# Patient Record
Sex: Female | Born: 1995 | Race: White | Hispanic: No | Marital: Single | Smoking: Never smoker
Health system: Southern US, Community
[De-identification: ages and names within clinical notes are randomized; demographics above are authoritative.]

## PROBLEM LIST (undated history)

## (undated) DIAGNOSIS — M545 Low back pain, unspecified: Secondary | ICD-10-CM

## (undated) DIAGNOSIS — S060XAA Concussion with loss of consciousness status unknown, initial encounter: Secondary | ICD-10-CM

## (undated) DIAGNOSIS — S060X9A Concussion with loss of consciousness of unspecified duration, initial encounter: Secondary | ICD-10-CM

---
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
## 2016-08-12 IMAGING — CR DG LUMBAR SPINE COMPLETE 4+V
5 series · 5 of 5 positions shown · non-contrast
Comparison: None.

CLINICAL DATA: Bilateral leg numbness

EXAM:
LUMBAR SPINE - COMPLETE 4+ VIEW

[l-spine ap]
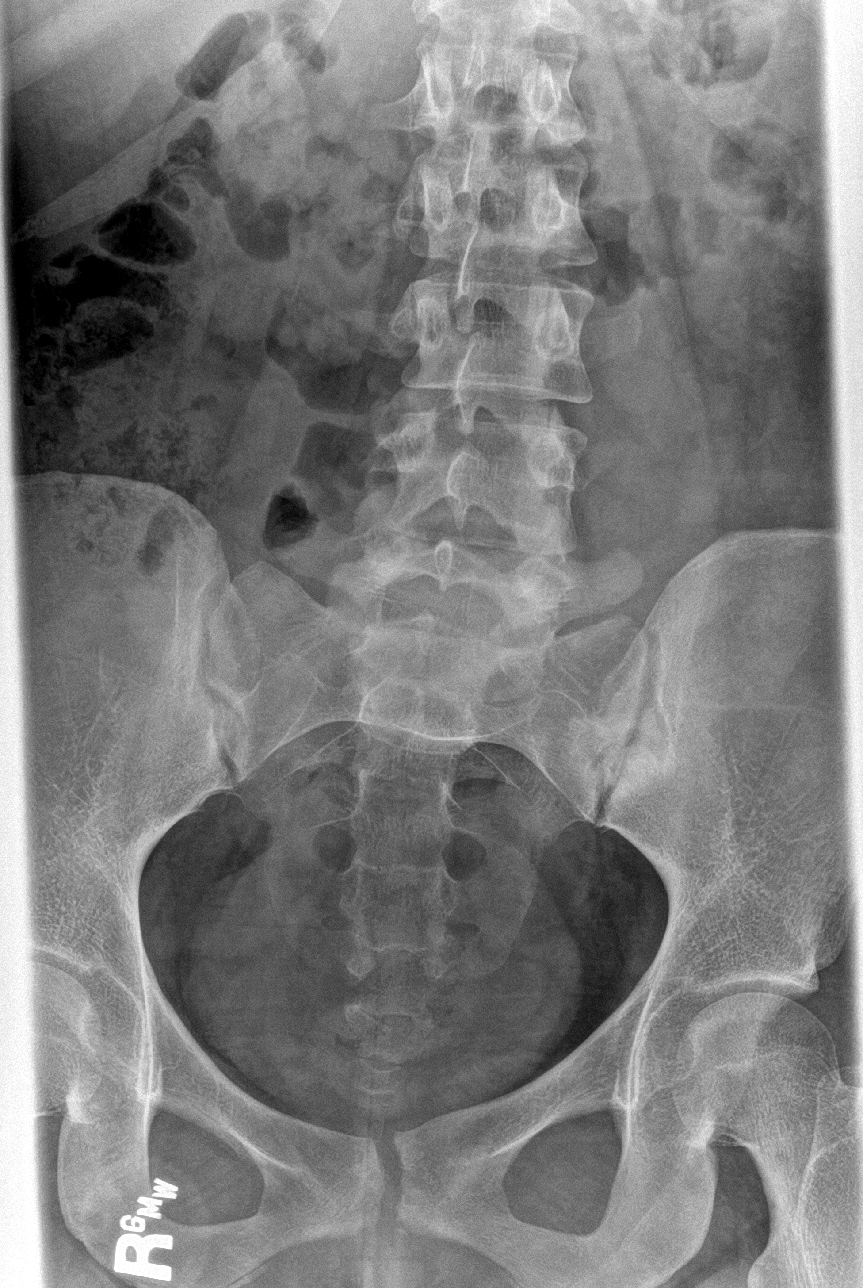

[l-spine obl (1 of 2)]
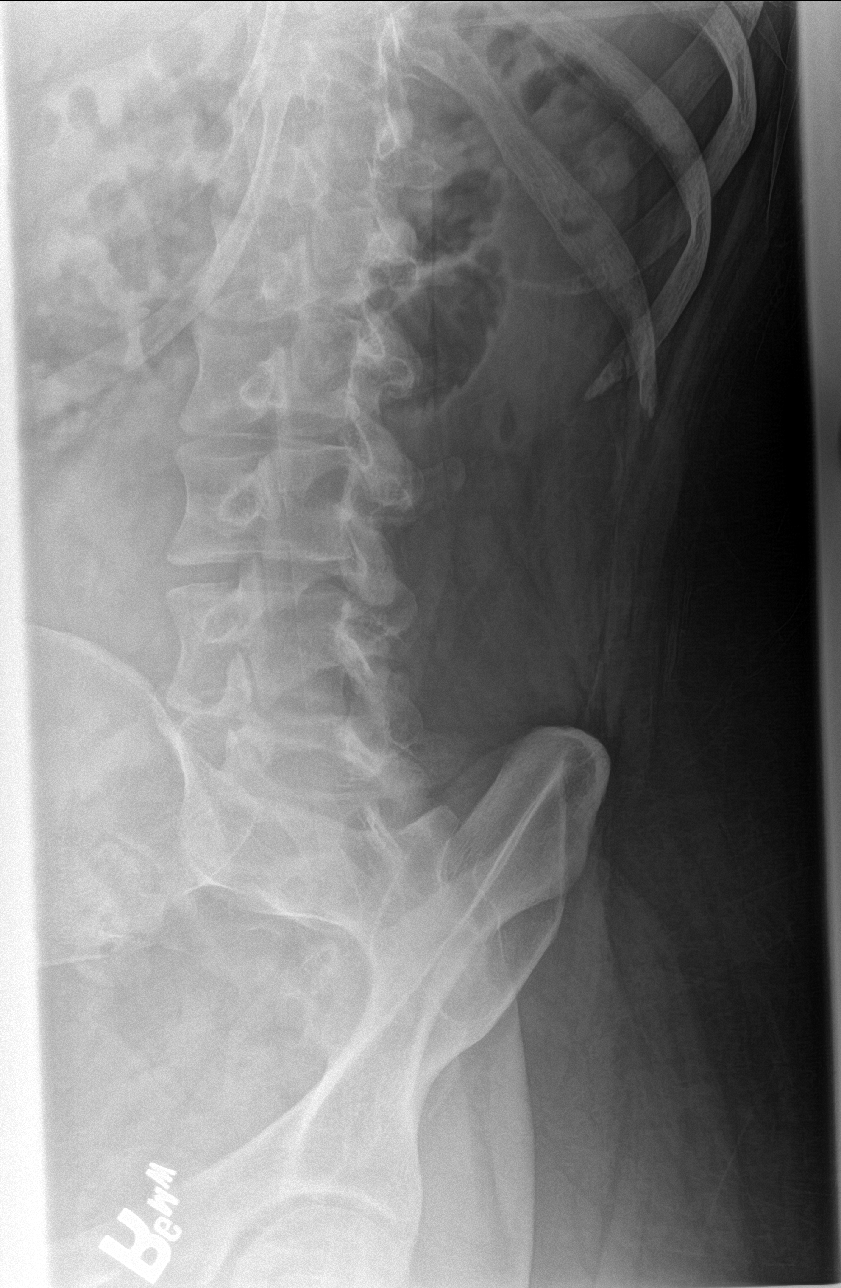

[l-spine obl (2 of 2)]
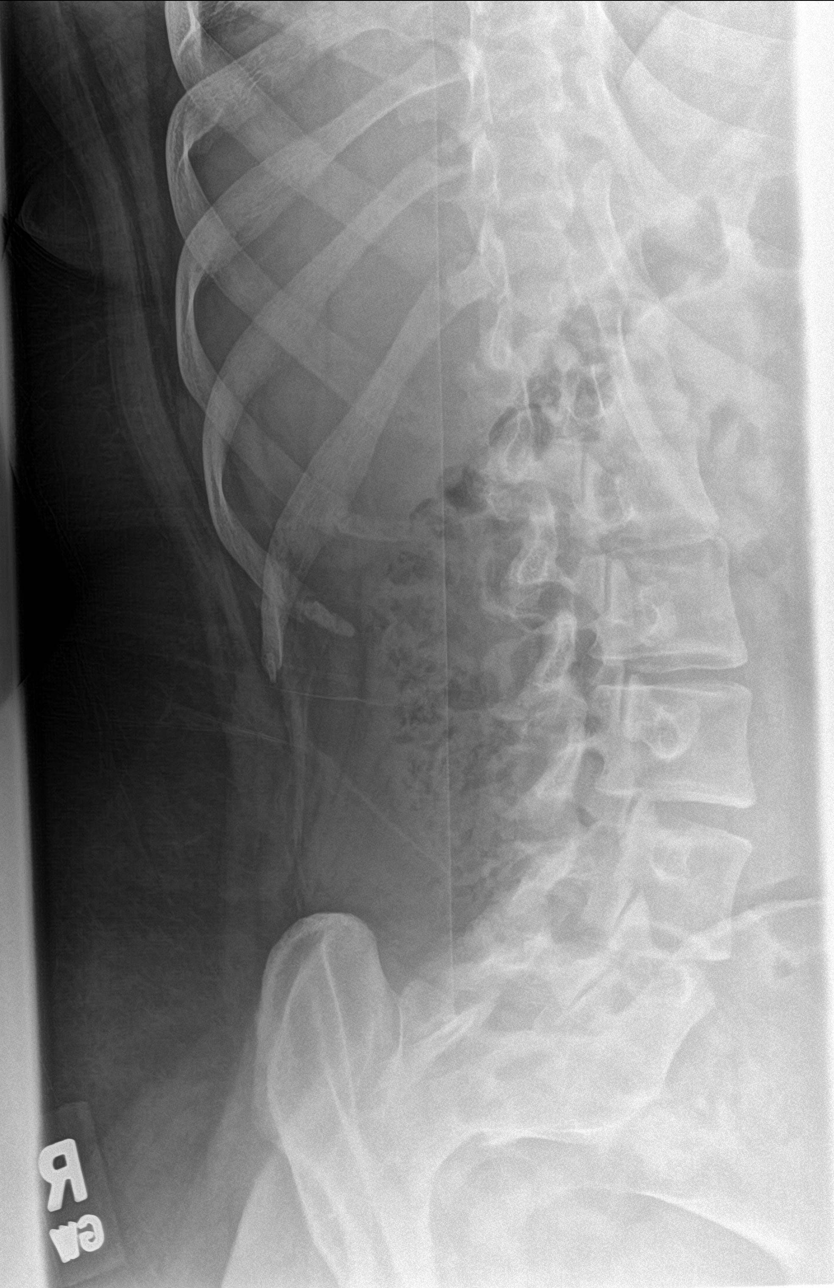

[l-spine lat]
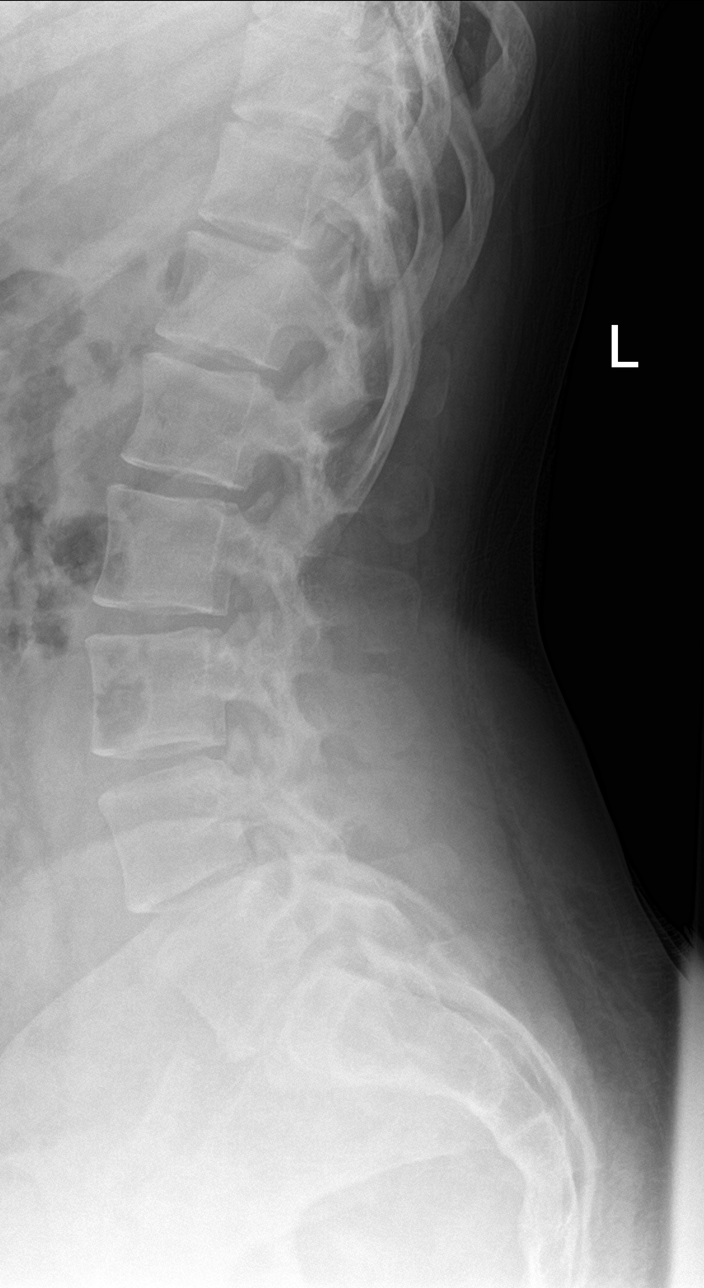

[l-spine spot]
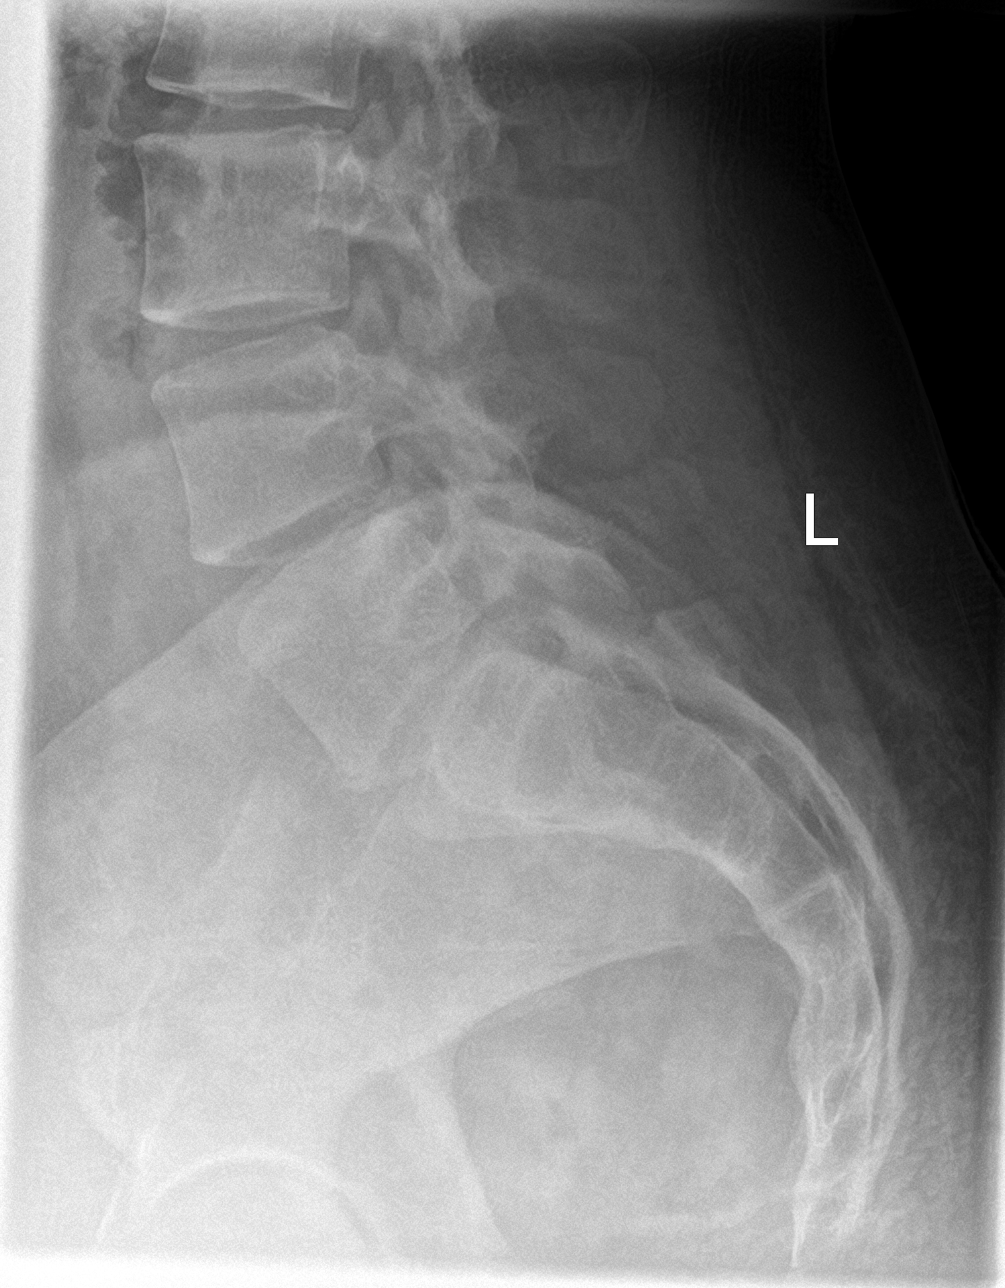

[5 of 5 positions shown; findings below may reference images not displayed]

FINDINGS: There is no evidence of lumbar spine fracture. Alignment is normal.
Disc space narrowing is noted at L4-5 and L5-S1. No soft tissue
abnormality is seen.
IMPRESSION: Mild degenerative change.

## 2017-01-06 ENCOUNTER — Encounter (HOSPITAL_COMMUNITY): Payer: Self-pay | Admitting: Emergency Medicine

## 2017-01-06 ENCOUNTER — Other Ambulatory Visit: Payer: Self-pay

## 2017-01-06 DIAGNOSIS — Z5321 Procedure and treatment not carried out due to patient leaving prior to being seen by health care provider: Secondary | ICD-10-CM | POA: Insufficient documentation

## 2017-01-06 DIAGNOSIS — R55 Syncope and collapse: Secondary | ICD-10-CM | POA: Diagnosis present

## 2017-01-06 NOTE — ED Triage Notes (Signed)
Report from [facility]> pt had syncopal episode while Face Timing in her dorm room.  Roommate found her face down with head under her desk.  Pt reports feeling dizzy prior to event.  Now c/o dizziness, pain across chest that is worse with palpation and deep inspiration, and pain to forehead.  Pt alert and oriented.  C-collar in place by EMS.  Pt denies neck pain.

## 2017-01-07 ENCOUNTER — Emergency Department (HOSPITAL_COMMUNITY)
Admission: EM | Admit: 2017-01-07 | Discharge: 2017-01-07 | Disposition: A | Discharge status: Home / Self Care | Payer: BLUE CROSS/BLUE SHIELD | Attending: Emergency Medicine | Admitting: Emergency Medicine

## 2017-01-07 HISTORY — DX: Low back pain: M54.5

## 2017-01-07 HISTORY — DX: Concussion with loss of consciousness status unknown, initial encounter: S06.0XAA

## 2017-01-07 HISTORY — DX: Concussion with loss of consciousness of unspecified duration, initial encounter: S06.0X9A

## 2017-01-07 HISTORY — DX: Low back pain, unspecified: M54.50

## 2017-01-07 LAB — CBC
HEMATOCRIT: 40.7 % (ref 36.0–46.0)
Hemoglobin: 13.4 g/dL (ref 12.0–15.0)
MCH: 28 pg (ref 26.0–34.0)
MCHC: 32.9 g/dL (ref 30.0–36.0)
MCV: 85.1 fL (ref 78.0–100.0)
Platelets: 242 10*3/uL (ref 150–400)
RBC: 4.78 MIL/uL (ref 3.87–5.11)
RDW: 12.9 % (ref 11.5–15.5)
WBC: 9.4 10*3/uL (ref 4.0–10.5)

## 2017-01-07 LAB — BASIC METABOLIC PANEL
ANION GAP: 9 (ref 5–15)
BUN: 7 mg/dL (ref 6–20)
CALCIUM: 9 mg/dL (ref 8.9–10.3)
CO2: 21 mmol/L — ABNORMAL LOW (ref 22–32)
Chloride: 106 mmol/L (ref 101–111)
Creatinine, Ser: 0.65 mg/dL (ref 0.44–1.00)
GFR calc Af Amer: >60 mL/min (ref >60)
Glucose, Bld: 103 mg/dL — ABNORMAL HIGH (ref 65–99)
POTASSIUM: 3.8 mmol/L (ref 3.5–5.1)
SODIUM: 136 mmol/L (ref 135–145)

## 2017-01-07 LAB — URINALYSIS, ROUTINE W REFLEX MICROSCOPIC
BILIRUBIN URINE: NEGATIVE
Glucose, UA: NEGATIVE mg/dL
Hgb urine dipstick: NEGATIVE
KETONES UR: NEGATIVE mg/dL
LEUKOCYTES UA: NEGATIVE
NITRITE: NEGATIVE
PH: 6 (ref 5.0–8.0)
PROTEIN: NEGATIVE mg/dL
Specific Gravity, Urine: 1.018 (ref 1.005–1.030)

## 2017-01-07 NOTE — ED Notes (Signed)
Pt does not want to wait any longer and is leaving AMA.
